# Patient Record
Sex: Female | Born: 1945 | Race: White | Hispanic: No | Smoking: Former smoker
Health system: Southern US, Community
[De-identification: ages and names within clinical notes are randomized; demographics above are authoritative.]

## PROBLEM LIST (undated history)

## (undated) DIAGNOSIS — I10 Essential (primary) hypertension: Secondary | ICD-10-CM

## (undated) HISTORY — PX: ABDOMINAL HYSTERECTOMY: SHX81

## (undated) HISTORY — PX: ILIAC ARTERY STENT: SHX1786

---

## 2019-12-02 ENCOUNTER — Encounter (HOSPITAL_BASED_OUTPATIENT_CLINIC_OR_DEPARTMENT_OTHER): Payer: Self-pay | Admitting: Emergency Medicine

## 2019-12-02 ENCOUNTER — Emergency Department (HOSPITAL_BASED_OUTPATIENT_CLINIC_OR_DEPARTMENT_OTHER): Payer: Medicare Other

## 2019-12-02 ENCOUNTER — Other Ambulatory Visit: Payer: Self-pay

## 2019-12-02 ENCOUNTER — Emergency Department (HOSPITAL_BASED_OUTPATIENT_CLINIC_OR_DEPARTMENT_OTHER)
Admission: EM | Admit: 2019-12-02 | Discharge: 2019-12-02 | Disposition: A | Discharge status: Home / Self Care | Payer: Medicare Other | Attending: Emergency Medicine | Admitting: Emergency Medicine

## 2019-12-02 DIAGNOSIS — Z87891 Personal history of nicotine dependence: Secondary | ICD-10-CM | POA: Insufficient documentation

## 2019-12-02 DIAGNOSIS — Z79899 Other long term (current) drug therapy: Secondary | ICD-10-CM | POA: Insufficient documentation

## 2019-12-02 DIAGNOSIS — Z7982 Long term (current) use of aspirin: Secondary | ICD-10-CM | POA: Insufficient documentation

## 2019-12-02 DIAGNOSIS — I10 Essential (primary) hypertension: Secondary | ICD-10-CM | POA: Diagnosis present

## 2019-12-02 HISTORY — DX: Essential (primary) hypertension: I10

## 2019-12-02 LAB — BASIC METABOLIC PANEL
Anion gap: 11 (ref 5–15)
BUN: 14 mg/dL (ref 8–23)
CO2: 26 mmol/L (ref 22–32)
Calcium: 9.5 mg/dL (ref 8.9–10.3)
Chloride: 101 mmol/L (ref 98–111)
Creatinine, Ser: 0.87 mg/dL (ref 0.44–1.00)
GFR calc Af Amer: >60 mL/min (ref >60)
GFR calc non Af Amer: >60 mL/min (ref >60)
Glucose, Bld: 117 mg/dL — ABNORMAL HIGH (ref 70–99)
Potassium: 3.9 mmol/L (ref 3.5–5.1)
Sodium: 138 mmol/L (ref 135–145)

## 2019-12-02 LAB — URINALYSIS, ROUTINE W REFLEX MICROSCOPIC
Bilirubin Urine: NEGATIVE
Glucose, UA: NEGATIVE mg/dL
Ketones, ur: NEGATIVE mg/dL
Nitrite: NEGATIVE
Protein, ur: NEGATIVE mg/dL
Specific Gravity, Urine: 1.01 (ref 1.005–1.030)
pH: 6.5 (ref 5.0–8.0)

## 2019-12-02 LAB — URINALYSIS, MICROSCOPIC (REFLEX)

## 2019-12-02 LAB — CBC
HCT: 40.1 % (ref 36.0–46.0)
Hemoglobin: 13 g/dL (ref 12.0–15.0)
MCH: 29.4 pg (ref 26.0–34.0)
MCHC: 32.4 g/dL (ref 30.0–36.0)
MCV: 90.7 fL (ref 80.0–100.0)
Platelets: 396 10*3/uL (ref 150–400)
RBC: 4.42 MIL/uL (ref 3.87–5.11)
RDW: 13 % (ref 11.5–15.5)
WBC: 7.9 10*3/uL (ref 4.0–10.5)
nRBC: 0 % (ref 0.0–0.2)

## 2019-12-02 LAB — TROPONIN I (HIGH SENSITIVITY): Troponin I (High Sensitivity): 4 ng/L (ref <18)

## 2019-12-02 NOTE — Discharge Instructions (Addendum)
Want you to follow-up with your primary care doctor tomorrow about medication changes due to your high blood pressure.  In the meantime keep taking your blood pressure medications as directed. It would be helpful to keep a log of your blood pressure, take your blood pressure in the morning at night. If you have any symptoms of severe headache, numbness and tingling, weakness, chest pain, shortness of breath, trouble urinating, back pain please come back to the emergency department.

## 2019-12-02 NOTE — ED Provider Notes (Addendum)
[facility] EMERGENCY DEPARTMENT Provider Note   CSN: [id number] Arrival date & time: 12/02/19  8563     History Chief Complaint  Patient presents with  . Hypertension    Deanna Robinson is a 74 y.o. female with past medical history of hypertension that presents the emergency department today for hypertension.  States that last night she noticed her blood pressure was 200/89, states that she take an extra half dose of her blood pressure medication and woke up this morning and it was still systolic 200.  States that she took her regular dose this morning and came to the emergency department and her blood pressure was 190/100.  Without any medication her blood pressure is now 170/71.  States that she had bronchitis 3 weeks ago, since then her blood pressure has been fluctuating.  States that she has been taking her prescribed losartan.  Denies any chest pain, shortness of breath, headache, vision changes, urinary symptoms, leg swelling, neck pain, back pain, abdominal pain, nausea, vomiting.  Denies any sick contacts.  Has gotten her Covid vaccines.  Has been stressed out this weekend because her great grandkids were in [place].  States that she has been drinking her normal amount of caffeine.  States that she has been placing nicotine patches on herself for the past couple of days.  Also admits to taking steroids from her recent bout of bronchitis. No NSAID use.  Has not been taking any other new medications.  Denies any numbness, tingling, weakness, syncope, speech abnormalities, facial droop, dizziness, headache. Pt mentioned one episode of jaw pain two days ago, no radiation. No chest pain, no current jaw pain. Lasted a couple seconds.   HPI     Past Medical History:  Diagnosis Date  . Hypertension     There are no problems to display for this patient.   Past Surgical History:  Procedure Laterality Date  . ABDOMINAL HYSTERECTOMY    . ILIAC ARTERY STENT       OB History   No  obstetric history on file.     No family history on file.  Social History   Tobacco Use  . Smoking status: Former Smoker    Quit date: 11/01/2019    Years since quitting: 0.0  . Smokeless tobacco: Never Used  Vaping Use  . Vaping Use: Never used  Substance Use Topics  . Alcohol use: Yes    Comment: socially  . Drug use: Never    Home Medications Prior to Admission medications   Medication Sig Start Date End Date Taking? Authorizing Provider  acetaminophen (TYLENOL) 325 MG tablet Take by mouth. 10/23/18  Yes [provider]  carvedilol (COREG) 12.5 MG tablet TAKE ONE TABLET BY MOUTH TWICE A DAY WITH A MEAL 07/22/14  Yes [provider]  cilostazol (PLETAL) 50 MG tablet Take 1 tablet by mouth 2 (two) times daily. 02/12/19  Yes [provider]  ergocalciferol (VITAMIN D2) 1.25 MG (50000 UT) capsule Take 1 capsule by mouth once a week. 08/27/14  Yes [provider]  fluticasone (FLONASE) 50 MCG/ACT nasal spray 1 spray by Each Nare route 2 times daily. 08/01/19  Yes [provider]  losartan (COZAAR) 100 MG tablet Take 1 tablet by mouth daily. 08/14/19  Yes [provider]  aspirin 81 MG EC tablet Take by mouth.    [provider]  famotidine (PEPCID) 40 MG tablet Take 40 mg by mouth daily. 11/22/19   [provider]  Allergies    Atorvastatin, Ibuprofen, Rosuvastatin, Simvastatin, and Clopidogrel  Review of Systems   Review of Systems  Constitutional: Negative for chills, diaphoresis, fatigue and fever.  HENT: Negative for congestion, sore throat and trouble swallowing.   Eyes: Negative for pain and visual disturbance.  Respiratory: Negative for cough, shortness of breath and wheezing.   Cardiovascular: Negative for chest pain, palpitations and leg swelling.  Gastrointestinal: Negative for abdominal distention, abdominal pain, diarrhea, nausea and vomiting.  Genitourinary: Negative for difficulty urinating.    Musculoskeletal: Negative for back pain, neck pain and neck stiffness.  Skin: Negative for pallor.  Neurological: Negative for dizziness, speech difficulty, weakness and headaches.  Psychiatric/Behavioral: Negative for confusion.    Physical Exam Updated Vital Signs BP (!) 156/56   Pulse 78   Temp 98.5 F (36.9 C) (Oral)   Resp 17   Ht  (1.676 m)   Wt 66.7 kg   SpO2 100%   BMI 23.73 kg/m   Physical Exam Constitutional:      General: She is not in acute distress.    Appearance: Normal appearance. She is not ill-appearing, toxic-appearing or diaphoretic.  HENT:     Mouth/Throat:     Mouth: Mucous membranes are moist.     Pharynx: Oropharynx is clear.  Eyes:     General: No scleral icterus.    Extraocular Movements: Extraocular movements intact.     Pupils: Pupils are equal, round, and reactive to light.  Cardiovascular:     Rate and Rhythm: Normal rate and regular rhythm.     Pulses: Normal pulses.     Heart sounds: Normal heart sounds.  Pulmonary:     Effort: Pulmonary effort is normal. No respiratory distress.     Breath sounds: Normal breath sounds. No stridor. No wheezing, rhonchi or rales.  Chest:     Chest wall: No tenderness.  Abdominal:     General: Abdomen is flat. There is no distension.     Palpations: Abdomen is soft.     Tenderness: There is no abdominal tenderness. There is no guarding or rebound.  Musculoskeletal:        General: No swelling or tenderness. Normal range of motion.     Cervical back: Normal range of motion and neck supple. No rigidity.     Right lower leg: No edema.     Left lower leg: No edema.  Skin:    General: Skin is warm and dry.     Capillary Refill: Capillary refill takes less than 2 seconds.     Coloration: Skin is not pale.  Neurological:     General: No focal deficit present.     Mental Status: She is alert and oriented to person, place, and time.     Comments: Alert. Clear speech. No facial droop. CNIII-XII grossly  intact. Bilateral upper and lower extremities' sensation grossly intact. 5/5 symmetric strength with grip strength and with plantar and dorsi flexion bilaterally. Normal finger to nose bilaterally. Negative pronator drift. Negative Romberg sign. Gait is steady and intact   Psychiatric:        Mood and Affect: Mood normal.        Behavior: Behavior normal.     ED Results / Procedures / Treatments   Labs (all labs ordered are listed, but only abnormal results are displayed) Labs Reviewed  BASIC METABOLIC PANEL - Abnormal; Notable for the following components:      Result Value   Glucose, Bld 117 (*)  All other components within normal limits  URINALYSIS, ROUTINE W REFLEX MICROSCOPIC - Abnormal; Notable for the following components:   Hgb urine dipstick TRACE (*)    Leukocytes,Ua SMALL (*)    All other components within normal limits  URINALYSIS, MICROSCOPIC (REFLEX) - Abnormal; Notable for the following components:   Bacteria, UA FEW (*)    All other components within normal limits  URINE CULTURE  CBC  TROPONIN I (HIGH SENSITIVITY)  TROPONIN I (HIGH SENSITIVITY)    EKG EKG Interpretation  Date/Time:  Sunday December 02 2019 10:09:09 EDT Ventricular Rate:  79 PR Interval:  130 QRS Duration: 82 QT Interval:  384 QTC Calculation: 440 R Axis:   68 Text Interpretation: Normal sinus rhythm Right atrial enlargement Borderline ECG No prior ECG for comparison. No STEMI Confirmed by Theda Belfast ([id number]) on 12/02/2019 2:53:48 PM   Radiology DG Chest 2 View  Result Date: 12/02/2019 CLINICAL DATA:  Jaw pain and elevated blood pressure EXAM: CHEST - 2 VIEW COMPARISON:  10/21/2018 FINDINGS: Artifact from EKG leads normal heart size and mediastinal contours. No acute infiltrate or edema. No effusion or pneumothorax. No acute osseous findings. IMPRESSION: No active cardiopulmonary disease. Electronically Signed   By: Marnee Spring M.D.   On: 12/02/2019 10:47    Procedures Procedures  (including critical care time)  Medications Ordered in ED Medications - No data to display  ED Course  I have reviewed the triage vital signs and the nursing notes.  Pertinent labs & imaging results that were available during my care of the patient were reviewed by me and considered in my medical decision making (see chart for details).    MDM Rules/Calculators/A&P                         Deanna Robinson is a 74 y.o. female with past medical history of hypertension that presents the emergency department today for hypertension.  Patient denies headache, change in vision, numbness, weakness, chest pain, dyspnea, dizziness, or lightheadedness therefore doubt hypertensive emergency. Discussed elevated blood pressure with the patient and the need for primary care follow up with potential need to initiate or change antihypertensive medications and or for further evaluation. Discussed return precaution signs/symptoms for hypertensive emergency as listed above with the patient.   CBC and CMP without any acute abnormalities.  First troponin 4.  Chest x-ray without any acute cardiopulmonary disease.  Urinalysis with small leukocytosis, patient is not having any urinary symptoms.  Did order urine culture.  Upon reassessment, patient still not complaining of any pain anywhere.  Son now in room.  Discussed return precautions in depth.  Did discuss that repeat troponin has not been drawn, they state that they do not want to wait for this.  I am okay with this since patient is not having any chest pain and other work-up is reassuring. Normal neuro exam. Pt to follow up with PCP tomorrow therefore will not change BP medications. At dc BP was 156/56 without intervention. No other vital sign abnormalities.  Discussed that BP could be up to due to stress, nicotine patches and steroids.   Doubt need for further emergent work up at this time. I explained the diagnosis and have given explicit precautions to return to the  ER including for any other new or worsening symptoms. The patient understands and accepts the medical plan as it's been dictated and I have answered their questions. Discharge instructions concerning home care and prescriptions have been given.  The patient is STABLE and is discharged to home in good condition.  I discussed this case with my attending physician who cosigned this note including patient's presenting symptoms, physical exam, and planned diagnostics and interventions. Attending physician stated agreement with plan or made changes to plan which were implemented.   Attending physician assessed patient at bedside.   Final Clinical Impression(s) / ED Diagnoses Final diagnoses:  Essential hypertension    Rx / DC Orders ED Discharge Orders    None          Farrel Gordon, PA-C 12/02/19 2041    Tegeler, Canary Brim, MD 12/04/19 1501

## 2019-12-02 NOTE — ED Triage Notes (Signed)
Pt reports high blood pressure last night. Pt took an extra half dose of BP medication last night and a regular dose this morning.

## 2019-12-02 NOTE — ED Notes (Signed)
Pt requesting something to drink; discussed with EDP; EDP will be in to see her shortly and will decide then; pt informed.

## 2019-12-02 NOTE — ED Notes (Signed)
Coke provided to pt to drink

## 2019-12-03 LAB — URINE CULTURE

## 2020-02-09 ENCOUNTER — Ambulatory Visit: Payer: Medicare Other

## 2021-11-17 IMAGING — CR DG CHEST 2V
2 series · 2 of 2 positions shown · non-contrast
Comparison: 10/21/2018

CLINICAL DATA: Jaw pain and elevated blood pressure

EXAM:
CHEST - 2 VIEW

[w chest pa]
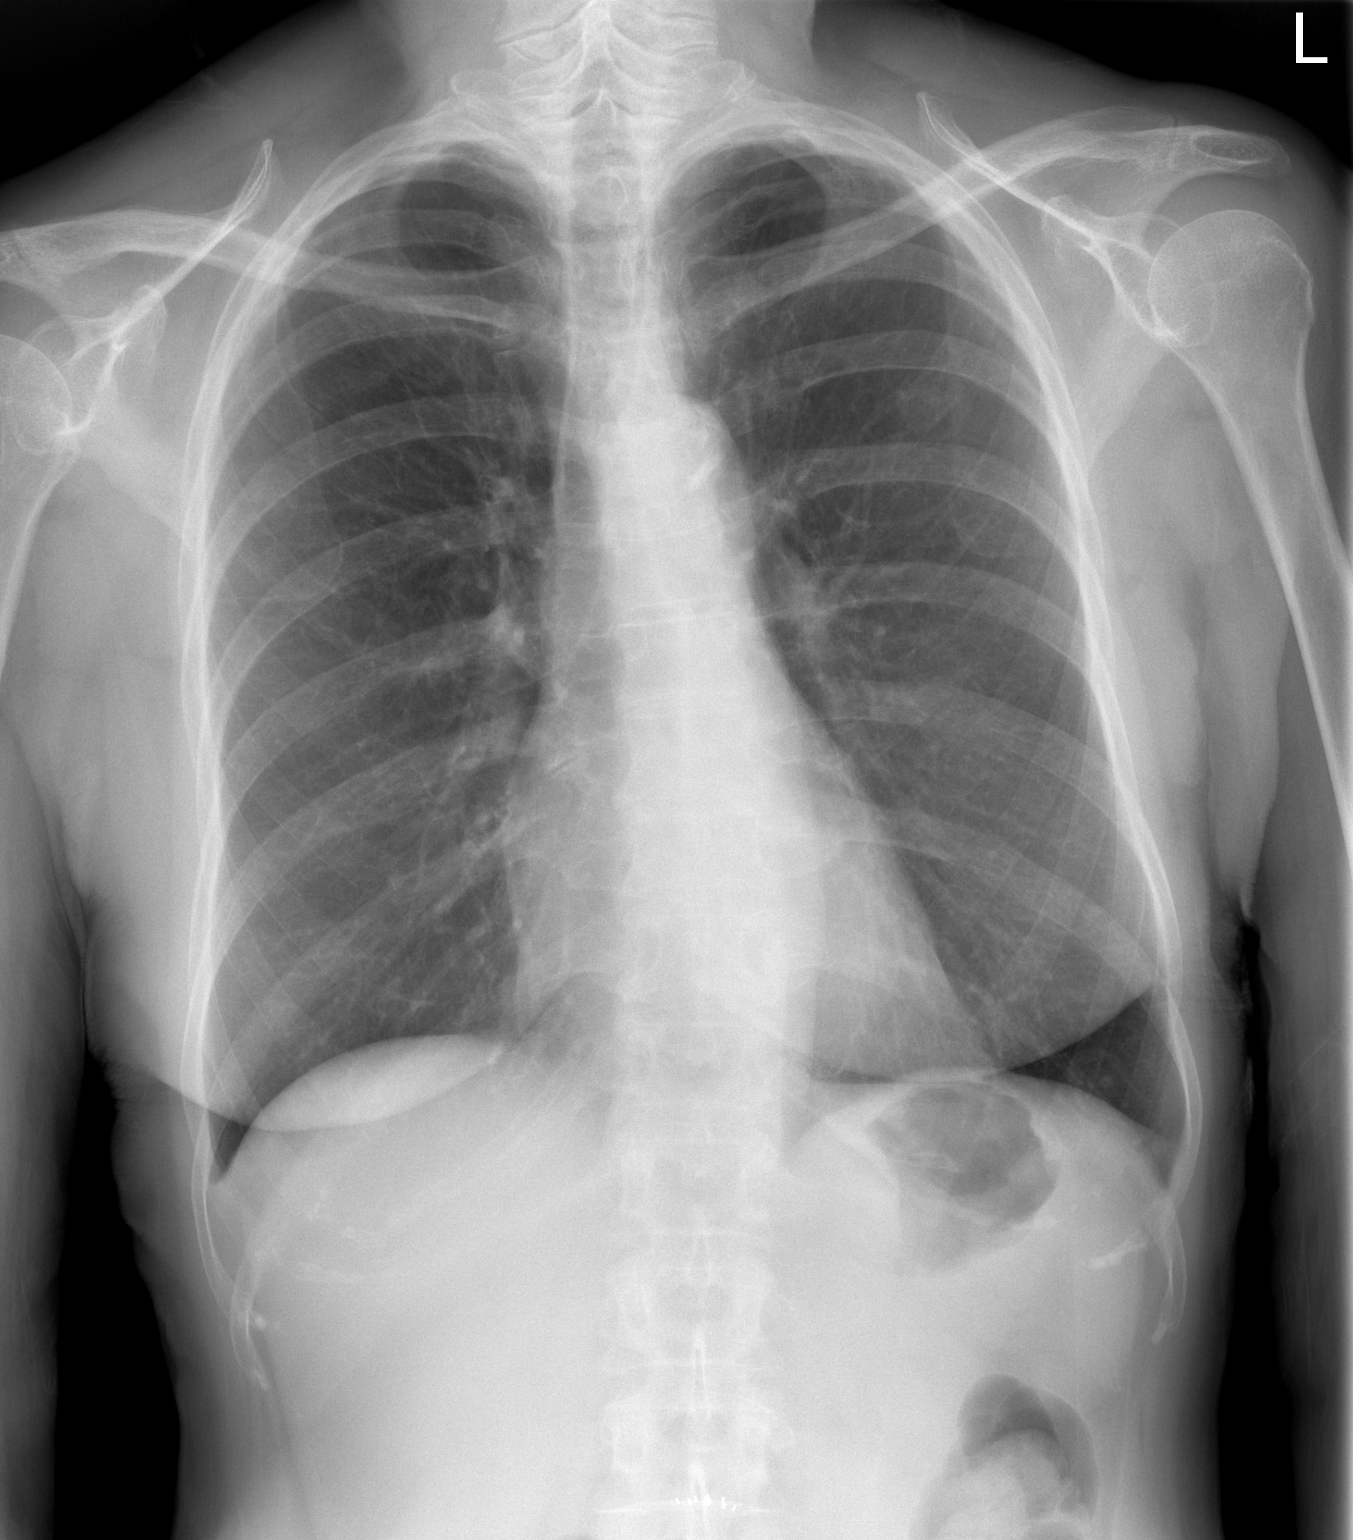

[w chest lat]
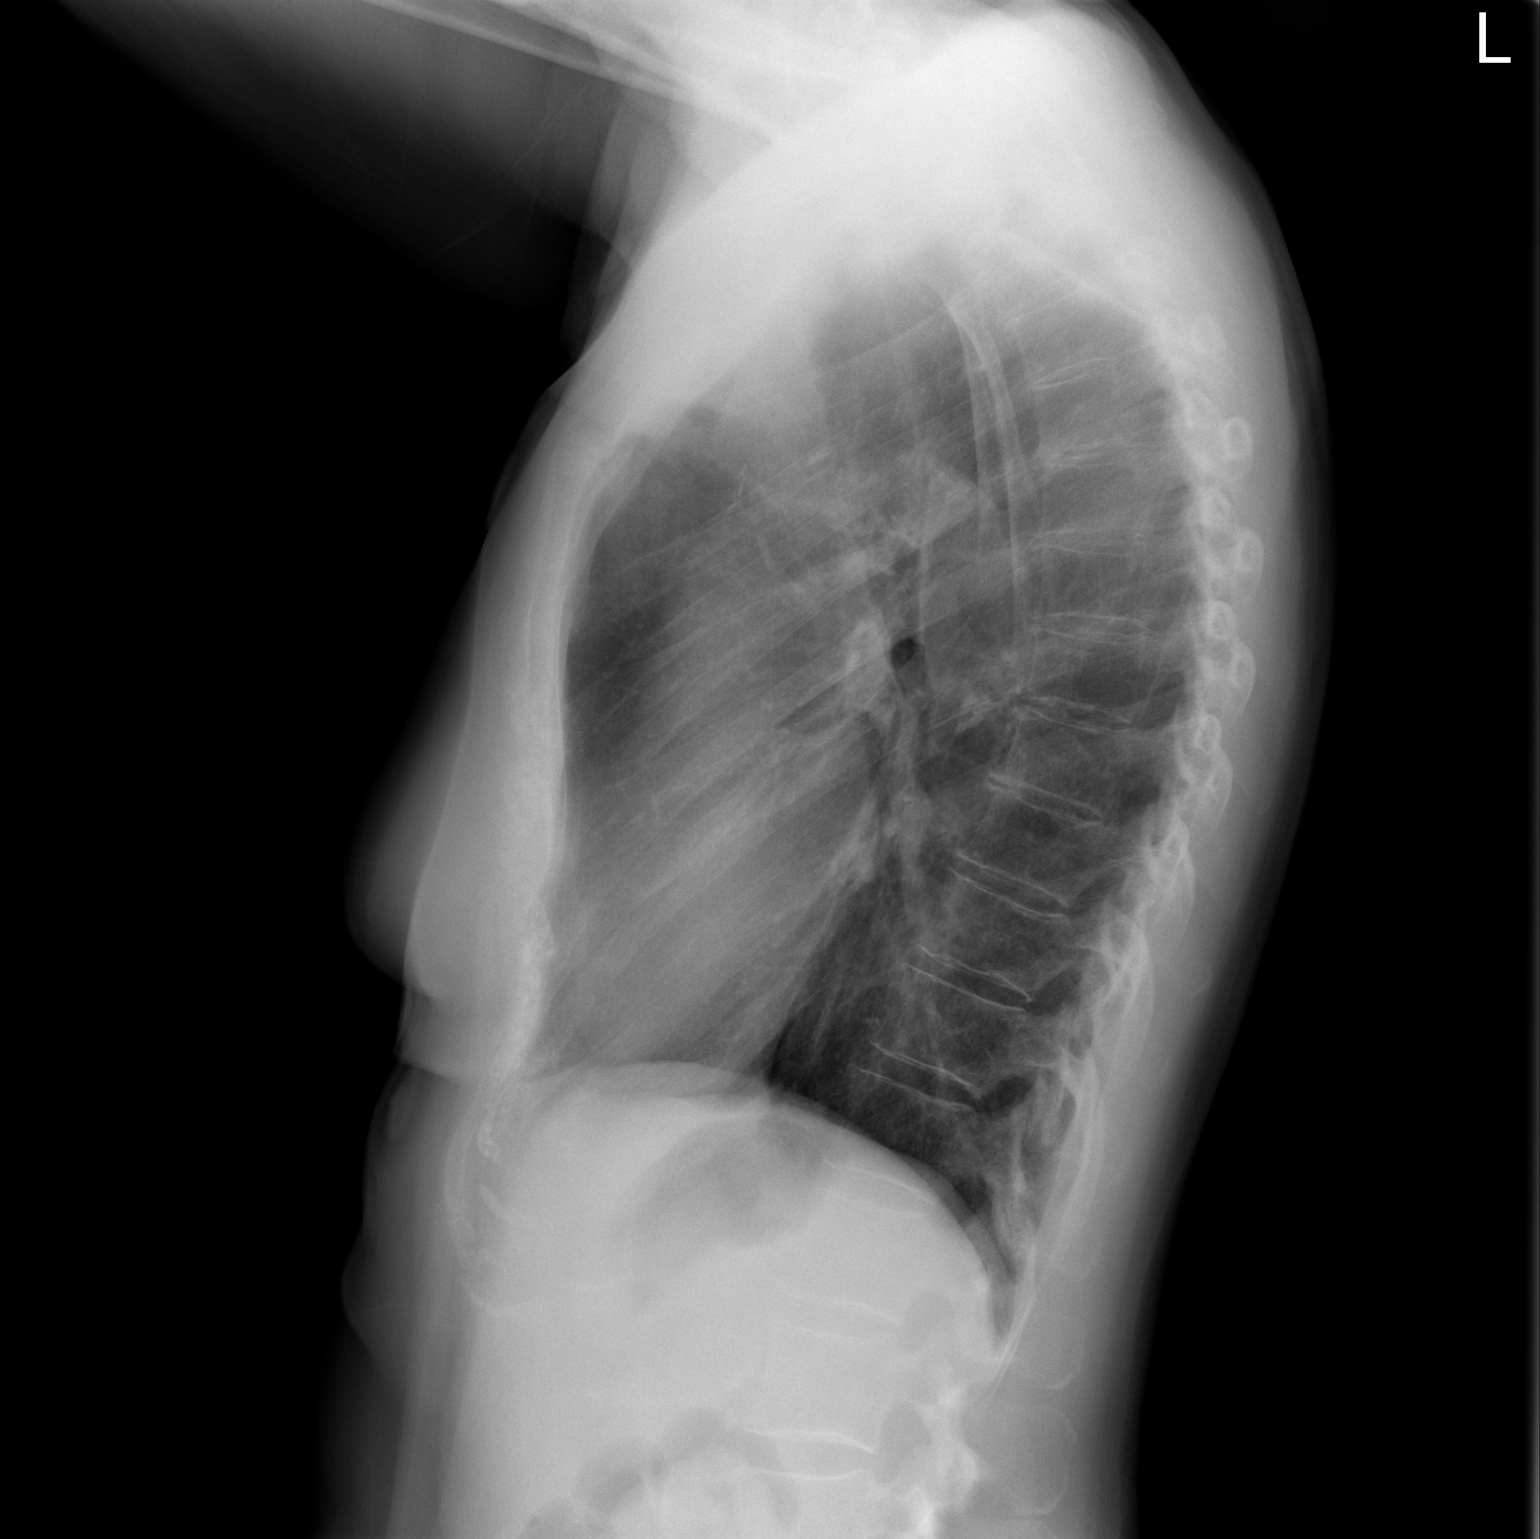

[2 of 2 positions shown; findings below may reference images not displayed]

FINDINGS: Artifact from EKG leads normal heart size and mediastinal contours.
No acute infiltrate or edema. No effusion or pneumothorax. No acute
osseous findings.
IMPRESSION: No active cardiopulmonary disease.
